# Patient Record
Sex: Male | Born: 1975 | Race: White | Hispanic: No | Marital: Single | Smoking: Never smoker
Health system: Southern US, Community
[De-identification: ages and names within clinical notes are randomized; demographics above are authoritative.]

## PROBLEM LIST (undated history)

## (undated) ENCOUNTER — Ambulatory Visit (HOSPITAL_COMMUNITY): Admission: EM | Disposition: A | Discharge status: Home / Self Care | Payer: Managed Care, Other (non HMO)

## (undated) DIAGNOSIS — S3992XA Unspecified injury of lower back, initial encounter: Secondary | ICD-10-CM

## (undated) HISTORY — PX: HERNIA REPAIR: SHX51

---

## 2003-06-08 ENCOUNTER — Emergency Department (HOSPITAL_COMMUNITY): Admission: EM | Admit: 2003-06-08 | Discharge: 2003-06-08 | Payer: Self-pay | Admitting: Emergency Medicine

## 2005-01-09 ENCOUNTER — Emergency Department (HOSPITAL_COMMUNITY): Admission: EM | Admit: 2005-01-09 | Discharge: 2005-01-09 | Payer: Self-pay | Admitting: *Deleted

## 2014-06-06 ENCOUNTER — Emergency Department (HOSPITAL_COMMUNITY)
Admission: EM | Admit: 2014-06-06 | Discharge: 2014-06-06 | Disposition: A | Discharge status: Home / Self Care | Payer: Managed Care, Other (non HMO) | Attending: Emergency Medicine | Admitting: Emergency Medicine

## 2014-06-06 ENCOUNTER — Encounter (HOSPITAL_COMMUNITY): Payer: Self-pay | Admitting: *Deleted

## 2014-06-06 ENCOUNTER — Emergency Department (HOSPITAL_COMMUNITY): Payer: Managed Care, Other (non HMO)

## 2014-06-06 DIAGNOSIS — M5416 Radiculopathy, lumbar region: Secondary | ICD-10-CM | POA: Insufficient documentation

## 2014-06-06 DIAGNOSIS — Z87828 Personal history of other (healed) physical injury and trauma: Secondary | ICD-10-CM | POA: Diagnosis not present

## 2014-06-06 DIAGNOSIS — Z880 Allergy status to penicillin: Secondary | ICD-10-CM | POA: Insufficient documentation

## 2014-06-06 DIAGNOSIS — M5410 Radiculopathy, site unspecified: Secondary | ICD-10-CM

## 2014-06-06 DIAGNOSIS — M545 Low back pain: Secondary | ICD-10-CM | POA: Diagnosis present

## 2014-06-06 HISTORY — DX: Unspecified injury of lower back, initial encounter: S39.92XA

## 2014-06-06 MED ORDER — PREDNISONE (PAK) 10 MG PO TABS: ORAL_TABLET | Freq: Every day | ORAL | Status: DC

## 2014-06-06 MED ORDER — METHYLPREDNISOLONE SODIUM SUCC 125 MG IJ SOLR
125.0000 mg | Freq: Once | INTRAMUSCULAR | Status: AC
  Administered 2014-06-06: 125 mg via INTRAVENOUS
  Filled 2014-06-06: qty 2

## 2014-06-06 MED ORDER — MORPHINE SULFATE 4 MG/ML IJ SOLN
4.0000 mg | Freq: Once | INTRAMUSCULAR | Status: AC
  Administered 2014-06-06: 4 mg via INTRAVENOUS
  Filled 2014-06-06: qty 1

## 2014-06-06 MED ORDER — DIAZEPAM 5 MG/ML IJ SOLN
5.0000 mg | Freq: Once | INTRAMUSCULAR | Status: AC
  Administered 2014-06-06: 5 mg via INTRAVENOUS
  Filled 2014-06-06: qty 2

## 2014-06-06 MED ORDER — OXYCODONE-ACETAMINOPHEN 5-325 MG PO TABS: 1.0000 | ORAL_TABLET | Freq: Four times a day (QID) | ORAL | Status: DC | PRN

## 2014-06-06 MED ORDER — HYDROMORPHONE HCL 1 MG/ML IJ SOLN
1.0000 mg | Freq: Once | INTRAMUSCULAR | Status: AC
  Administered 2014-06-06: 1 mg via INTRAVENOUS
  Filled 2014-06-06: qty 1

## 2014-06-06 MED ORDER — DOCUSATE SODIUM 100 MG PO CAPS: 100.0000 mg | ORAL_CAPSULE | Freq: Two times a day (BID) | ORAL | Status: DC

## 2014-06-06 MED ORDER — KETOROLAC TROMETHAMINE 30 MG/ML IJ SOLN
30.0000 mg | Freq: Once | INTRAMUSCULAR | Status: AC
  Administered 2014-06-06: 30 mg via INTRAVENOUS
  Filled 2014-06-06: qty 1

## 2014-06-06 NOTE — ED Provider Notes (Signed)
CSN: [id number]     Arrival date & time 06/06/14  1358 History   First MD Initiated Contact with Patient 06/06/14 1418     Chief Complaint  Patient presents with  . Back Pain     (Consider location/radiation/quality/duration/timing/severity/associated sxs/prior Treatment) HPI Comments: Patient is a 39 year old male with no significant past medical history. He presents today with complaints of severe low back pain. He was assembling a piece of furniture in his living room when he reached for one of the pieces and felt a severe pain in his low back. Pain radiates into both legs and makes it difficult for him to walk. He feels as though both legs are weak. He denies any bowel or bladder complaints.  Patient is a 39 y.o. male presenting with back pain. The history is provided by the patient.  Back Pain Location:  Lumbar spine Quality:  Stabbing Radiates to:  L foot and R foot Pain severity:  Severe Onset quality:  Sudden Duration:  2 hours Timing:  Constant Progression:  Unchanged Chronicity:  New Relieved by:  Nothing Worsened by:  Ambulation, bending, movement, standing and palpation Ineffective treatments:  Muscle relaxants   Past Medical History  Diagnosis Date  . Back injury    Past Surgical History  Procedure Laterality Date  . Hernia repair     No family history on file. History  Substance Use Topics  . Smoking status: Never Smoker   . Smokeless tobacco: Not on file  . Alcohol Use: No    Review of Systems  Musculoskeletal: Positive for back pain.  All other systems reviewed and are negative.     Allergies  Penicillins  Home Medications   Prior to Admission medications   Not on File   BP 126/71 mmHg  Pulse 69  Temp(Src) 98.4 F (36.9 C) (Oral)  Resp 13  SpO2 97% Physical Exam  Constitutional: He is oriented to person, place, and time. He appears well-developed and well-nourished. No distress.  HENT:  Head: Normocephalic and atraumatic.  Neck:  Normal range of motion. Neck supple.  Musculoskeletal: Normal range of motion.  Neurological: He is alert and oriented to person, place, and time.  DTRs are 3+ and symmetrical in the bilateral lower extremities. Strength is 5 out of 5 in the bilateral lower extremities. Any attempts to change position results in severe pain and spasm.  Skin: Skin is warm and dry. He is not diaphoretic.  Nursing note and vitals reviewed.   ED Course  Procedures (including critical care time) Labs Review Labs Reviewed - No data to display  Imaging Review No results found.   EKG Interpretation None      MDM   Final diagnoses:  Radicular low back pain    Patient presents with complaints of severe low back pain and an inability to ambulate due to the discomfort. An MRI will be obtained to rule out an emergent cause of his discomfort. He is now feeling better with pain medication and steroids. Care will be signed out at shift change to Dr. Romeo AppleHarrison who will obtain the results of the MRI and determine the final disposition.    Geoffery Lyonsouglas Kianna Billet, MD 06/07/14 [phone]

## 2014-06-06 NOTE — ED Notes (Signed)
Per EMS- pt was leaning forward to pick up something when he had "tearing sound and burn" down in lower back. Pt reports numbness in legs. Pt states that he has had a back injury in the past. Pt states that he was unable to bear weight on legs. Pt received of fentanyl en route.

## 2014-06-06 NOTE — ED Notes (Signed)
Pt verbalized understanding of d/c instructions, follow up information with neurosurgeon, and prescriptions, has no further questions.

## 2014-06-06 NOTE — Discharge Instructions (Signed)
Back Pain, Adult °Back pain is very common. The pain often gets better over time. The cause of back pain is usually not dangerous. Most people can learn to manage their back pain on their own.  °HOME CARE  °· Stay active. Start with short walks on flat ground if you can. Try to walk farther each day. °· Do not sit, drive, or stand in one place for more than 30 minutes. Do not stay in bed. °· Do not avoid exercise or work. Activity can help your back heal faster. °· Be careful when you bend or lift an object. Bend at your knees, keep the object close to you, and do not twist. °· Sleep on a firm mattress. Lie on your side, and bend your knees. If you lie on your back, put a pillow under your knees. °· Only take medicines as told by your doctor. °· Put ice on the injured area. °¨ Put ice in a plastic bag. °¨ Place a towel between your skin and the bag. °¨ Leave the ice on for 15-20 minutes, 03-04 times a day for the first 2 to 3 days. After that, you can switch between ice and heat packs. °· Ask your doctor about back exercises or massage. °· Avoid feeling anxious or stressed. Find good ways to deal with stress, such as exercise. °GET HELP RIGHT AWAY IF:  °· Your pain does not go away with rest or medicine. °· Your pain does not go away in 1 week. °· You have new problems. °· You do not feel well. °· The pain spreads into your legs. °· You cannot control when you poop (bowel movement) or pee (urinate). °· Your arms or legs feel weak or lose feeling (numbness). °· You feel sick to your stomach (nauseous) or throw up (vomit). °· You have belly (abdominal) pain. °· You feel like you may pass out (faint). °MAKE SURE YOU:  °· Understand these instructions. °· Will watch your condition. °· Will get help right away if you are not doing well or get worse. °Document Released: 09/10/2007 Document Revised: 06/16/2011 Document Reviewed: 07/26/2013 °Exit[employer]® Patient Information ©2015 [employer]. This information is not intended  to replace advice given to you by your health care provider. Make sure you discuss any questions you have with your health care provider. ° °

## 2014-06-06 NOTE — ED Notes (Signed)
Pt given urinal and helped over to his side to urinate.

## 2020-05-08 DIAGNOSIS — Z419 Encounter for procedure for purposes other than remedying health state, unspecified: Secondary | ICD-10-CM | POA: Diagnosis not present

## 2020-06-05 DIAGNOSIS — Z419 Encounter for procedure for purposes other than remedying health state, unspecified: Secondary | ICD-10-CM | POA: Diagnosis not present

## 2020-07-06 DIAGNOSIS — Z419 Encounter for procedure for purposes other than remedying health state, unspecified: Secondary | ICD-10-CM | POA: Diagnosis not present

## 2020-08-05 DIAGNOSIS — Z419 Encounter for procedure for purposes other than remedying health state, unspecified: Secondary | ICD-10-CM | POA: Diagnosis not present

## 2020-09-05 DIAGNOSIS — Z419 Encounter for procedure for purposes other than remedying health state, unspecified: Secondary | ICD-10-CM | POA: Diagnosis not present

## 2020-10-05 DIAGNOSIS — Z419 Encounter for procedure for purposes other than remedying health state, unspecified: Secondary | ICD-10-CM | POA: Diagnosis not present

## 2020-11-05 DIAGNOSIS — Z419 Encounter for procedure for purposes other than remedying health state, unspecified: Secondary | ICD-10-CM | POA: Diagnosis not present

## 2020-12-06 DIAGNOSIS — Z419 Encounter for procedure for purposes other than remedying health state, unspecified: Secondary | ICD-10-CM | POA: Diagnosis not present

## 2021-01-05 DIAGNOSIS — Z419 Encounter for procedure for purposes other than remedying health state, unspecified: Secondary | ICD-10-CM | POA: Diagnosis not present

## 2021-02-05 DIAGNOSIS — Z419 Encounter for procedure for purposes other than remedying health state, unspecified: Secondary | ICD-10-CM | POA: Diagnosis not present

## 2021-03-07 DIAGNOSIS — Z419 Encounter for procedure for purposes other than remedying health state, unspecified: Secondary | ICD-10-CM | POA: Diagnosis not present

## 2021-04-07 DIAGNOSIS — Z419 Encounter for procedure for purposes other than remedying health state, unspecified: Secondary | ICD-10-CM | POA: Diagnosis not present

## 2021-04-13 ENCOUNTER — Encounter (HOSPITAL_BASED_OUTPATIENT_CLINIC_OR_DEPARTMENT_OTHER): Payer: Self-pay | Admitting: *Deleted

## 2021-04-13 ENCOUNTER — Other Ambulatory Visit: Payer: Self-pay

## 2021-04-13 ENCOUNTER — Emergency Department (HOSPITAL_BASED_OUTPATIENT_CLINIC_OR_DEPARTMENT_OTHER): Payer: Medicaid Other | Admitting: Radiology

## 2021-04-13 ENCOUNTER — Emergency Department (HOSPITAL_BASED_OUTPATIENT_CLINIC_OR_DEPARTMENT_OTHER)
Admission: EM | Admit: 2021-04-13 | Discharge: 2021-04-14 | Disposition: A | Discharge status: Home / Self Care | Payer: Medicaid Other | Attending: Emergency Medicine | Admitting: Emergency Medicine

## 2021-04-13 DIAGNOSIS — T18128A Food in esophagus causing other injury, initial encounter: Secondary | ICD-10-CM | POA: Insufficient documentation

## 2021-04-13 DIAGNOSIS — R062 Wheezing: Secondary | ICD-10-CM | POA: Insufficient documentation

## 2021-04-13 DIAGNOSIS — M542 Cervicalgia: Secondary | ICD-10-CM | POA: Diagnosis not present

## 2021-04-13 DIAGNOSIS — Z20822 Contact with and (suspected) exposure to covid-19: Secondary | ICD-10-CM | POA: Insufficient documentation

## 2021-04-13 DIAGNOSIS — K2900 Acute gastritis without bleeding: Secondary | ICD-10-CM | POA: Diagnosis not present

## 2021-04-13 DIAGNOSIS — K2091 Esophagitis, unspecified with bleeding: Secondary | ICD-10-CM | POA: Insufficient documentation

## 2021-04-13 DIAGNOSIS — K222 Esophageal obstruction: Secondary | ICD-10-CM | POA: Diagnosis not present

## 2021-04-13 DIAGNOSIS — M795 Residual foreign body in soft tissue: Secondary | ICD-10-CM

## 2021-04-13 DIAGNOSIS — R079 Chest pain, unspecified: Secondary | ICD-10-CM | POA: Diagnosis not present

## 2021-04-13 DIAGNOSIS — X58XXXA Exposure to other specified factors, initial encounter: Secondary | ICD-10-CM | POA: Insufficient documentation

## 2021-04-13 DIAGNOSIS — T189XXA Foreign body of alimentary tract, part unspecified, initial encounter: Secondary | ICD-10-CM | POA: Diagnosis not present

## 2021-04-13 LAB — RESP PANEL BY RT-PCR (FLU A&B, COVID) ARPGX2
Influenza A by PCR: NEGATIVE
Influenza B by PCR: NEGATIVE
SARS Coronavirus 2 by RT PCR: NEGATIVE

## 2021-04-13 MED ORDER — LORAZEPAM 2 MG/ML IJ SOLN
1.0000 mg | Freq: Once | INTRAMUSCULAR | Status: AC
  Administered 2021-04-13: 1 mg via INTRAVENOUS
  Filled 2021-04-13: qty 1

## 2021-04-13 MED ORDER — PROPOFOL 10 MG/ML IV BOLUS
INTRAVENOUS | Status: AC
  Filled 2021-04-13: qty 20

## 2021-04-13 MED ORDER — GLUCAGON HCL RDNA (DIAGNOSTIC) 1 MG IJ SOLR
1.0000 mg | Freq: Once | INTRAMUSCULAR | Status: AC
  Administered 2021-04-13: 1 mg via INTRAVENOUS
  Filled 2021-04-13: qty 1

## 2021-04-13 NOTE — ED Provider Notes (Signed)
Patient transferred from [facility] for endoscopy.  He was eating ribs around 530 and feels like they are stuck in his esophagus.  He has been unable to tolerate oral since this time.  Plan to transfer to endoscopy suite for further management.   Patient returned from endoscopy suite.  He is feeling improved and able to swallow without difficulty.  Plan to discharge home with outpatient follow-up.   Tilden Fossa, MD 04/14/21 [phone]

## 2021-04-13 NOTE — ED Notes (Signed)
Patient off floor for scan 

## 2021-04-13 NOTE — ED Triage Notes (Addendum)
eating ribs at 1730 and food became lodged, nothing will go down, water comes back up, no h/o same.

## 2021-04-13 NOTE — Discharge Instructions (Addendum)
CLEAR LIQUIDS ONLY for the next 12 hours and then start soups and broths and advance diet as tolerated. AVOID ALL MEATS for the next 48 hours. Take small bites of food, eat slowly, and chew very well. Start taking Omeprazole (Prilosec) 20 mg once a day. A repeat upper endoscopy (EGD) with possible dilation will be needed in 4 weeks and our office will arrange that as well as follow-up.

## 2021-04-13 NOTE — ED Notes (Signed)
Patient in room, wife at bedside.

## 2021-04-13 NOTE — ED Provider Notes (Signed)
[facility] EMERGENCY DEPT Provider Note   CSN: [id number] Arrival date & time: 04/13/21  6720     History  Chief Complaint  Patient presents with   Swallowed Foreign Body    NESANEL AGUILA is a 46 y.o. male who presents to the emergency department complaining of a swallowed foreign body.  Patient was eating ribs at 1730, and food became lodged.  He states that nothing will go down, and water comes back up.  He has associated chest pain, and feels he is wheezing when he takes deep breaths through his mouth.   Swallowed Foreign Body      Home Medications Prior to Admission medications   Medication Sig Start Date End Date Taking? Authorizing Provider  docusate sodium (COLACE) 100 MG capsule Take 1 capsule (100 mg total) by mouth every 12 (twelve) hours. 06/06/14   Purvis Sheffield, MD  ibuprofen (ADVIL,MOTRIN) 200 MG tablet Take 200 mg by mouth every 6 (six) hours as needed.    [provider]  methocarbamol (ROBAXIN) 750 MG tablet Take 750 mg by mouth 2 (two) times daily as needed for muscle spasms.    [provider]  oxyCODONE-acetaminophen (PERCOCET) 5-325 MG per tablet Take 1-2 tablets by mouth every 6 (six) hours as needed for moderate pain. 06/06/14   Purvis Sheffield, MD  predniSONE (STERAPRED UNI-PAK) 10 MG tablet Take by mouth daily. Tape 4 tablets by mouth daily for 3 days. Then take 3 tablets by mouth daily for 3 days. Intake 2 tablets by mouth daily for 3 days. Then take 1 tablet by mouth daily for 3 days. 06/06/14   Purvis Sheffield, MD      Allergies    Penicillins    Review of Systems   Review of Systems  HENT:  Positive for trouble swallowing.   All other systems reviewed and are negative.  Physical Exam Updated Vital Signs BP (!) 143/98    Pulse 89    Temp 98.7 F (37.1 C)    Resp 19    Ht 6\' 1"  (1.854 m)    Wt 113.4 kg    SpO2 98%    BMI 32.98 kg/m  Physical Exam Vitals and nursing note reviewed.  Constitutional:       Appearance: Normal appearance.  HENT:     Head: Normocephalic and atraumatic.  Eyes:     Conjunctiva/sclera: Conjunctivae normal.  Neck:     Trachea: Trachea normal.  Pulmonary:     Effort: Pulmonary effort is normal. No respiratory distress.     Breath sounds: No stridor. No wheezing.  Skin:    General: Skin is warm and dry.  Neurological:     Mental Status: He is alert.  Psychiatric:        Mood and Affect: Mood normal.        Behavior: Behavior normal.    ED Results / Procedures / Treatments   Labs (all labs ordered are listed, but only abnormal results are displayed) Labs Reviewed - No data to display  EKG None  Radiology DG Neck Soft Tissue  Result Date: 04/13/2021 CLINICAL DATA:  Food lodged. EXAM: NECK SOFT TISSUES - 1+ VIEW COMPARISON:  None. FINDINGS: There is no evidence of retropharyngeal soft tissue swelling or epiglottic enlargement. The cervical airway is unremarkable and no radio-opaque foreign body identified. IMPRESSION: Negative. Electronically Signed   By: 06/11/2021 M.D.   On: 04/13/2021 20:56    Procedures Procedures    Medications Ordered in ED Medications  LORazepam (ATIVAN) injection 1 mg (has no administration in time range)  glucagon (human recombinant) (GLUCAGEN) injection 1 mg (has no administration in time range)    ED Course/ Medical Decision Making/ A&P                           Medical Decision Making Patient is a 46 year old male presents emergency department for swallowed foreign body.  Patient was eating ribs at 1730 when food became lodged, he states that nothing will go down.    He is oxygenating well on my exam.  He states he is having some wheezing when he takes deep breaths or his mouth, but there is no obvious stridor or wheezing on lung auscultation.  X-ray performed shows no retained radiopaque foreign body.  I agree with radiologist interpretation.  Patient given 1 dose of Ativan and glucagon with no  improvement.  10:00pm Consulted Dr. Bosie Clos with gastroenterology who recommended patient get an endoscopy at [facility]. Will obtain pre-op COVID test.   10:10pm Consulted ED physician Dr. Effie Shy at [facility] who agreed to accept patient and contact GI upon patient arrival. Patient will go POV. The patient appears reasonably stabilized for transfer considering the current resources, flow, and capabilities available in the ED at this time, and I doubt any other [facility] requiring further screening and/or treatment in the ED prior to transfer.   I discussed this case with my attending physician Dr. Anitra Lauth who cosigned this note including patient's presenting symptoms, physical exam, and planned diagnostics and interventions. Attending physician stated agreement with plan or made changes to plan which were implemented.    Final Clinical Impression(s) / ED Diagnoses Final diagnoses:  Neck pain after ingestion of foreign body    Rx / DC Orders ED Discharge Orders     None      Portions of this report may have been transcribed using voice recognition software. Every effort was made to ensure accuracy; however, inadvertent computerized transcription errors may be present.    Jeanella Flattery 04/13/21 2216    Gwyneth Sprout, MD 04/13/21 2326

## 2021-04-14 ENCOUNTER — Emergency Department (HOSPITAL_COMMUNITY): Payer: Medicaid Other | Admitting: Certified Registered Nurse Anesthetist

## 2021-04-14 ENCOUNTER — Encounter (HOSPITAL_COMMUNITY)
Admission: EM | Disposition: A | Discharge status: Home / Self Care | Payer: Self-pay | Source: Home / Self Care | Attending: Emergency Medicine

## 2021-04-14 DIAGNOSIS — K2091 Esophagitis, unspecified with bleeding: Secondary | ICD-10-CM | POA: Diagnosis not present

## 2021-04-14 DIAGNOSIS — R079 Chest pain, unspecified: Secondary | ICD-10-CM | POA: Diagnosis not present

## 2021-04-14 DIAGNOSIS — T18108A Unspecified foreign body in esophagus causing other injury, initial encounter: Secondary | ICD-10-CM | POA: Diagnosis not present

## 2021-04-14 DIAGNOSIS — W44F3XA Food entering into or through a natural orifice, initial encounter: Secondary | ICD-10-CM | POA: Diagnosis present

## 2021-04-14 DIAGNOSIS — T18128A Food in esophagus causing other injury, initial encounter: Secondary | ICD-10-CM | POA: Diagnosis not present

## 2021-04-14 DIAGNOSIS — Z20822 Contact with and (suspected) exposure to covid-19: Secondary | ICD-10-CM | POA: Diagnosis not present

## 2021-04-14 DIAGNOSIS — K2900 Acute gastritis without bleeding: Secondary | ICD-10-CM | POA: Diagnosis not present

## 2021-04-14 DIAGNOSIS — K222 Esophageal obstruction: Secondary | ICD-10-CM | POA: Diagnosis not present

## 2021-04-14 DIAGNOSIS — R062 Wheezing: Secondary | ICD-10-CM | POA: Diagnosis not present

## 2021-04-14 DIAGNOSIS — M542 Cervicalgia: Secondary | ICD-10-CM | POA: Diagnosis not present

## 2021-04-14 HISTORY — PX: ESOPHAGOGASTRODUODENOSCOPY: SHX5428

## 2021-04-14 HISTORY — PX: FOREIGN BODY REMOVAL: SHX962

## 2021-04-14 SURGERY — EGD (ESOPHAGOGASTRODUODENOSCOPY)
Anesthesia: General

## 2021-04-14 MED ORDER — FENTANYL CITRATE (PF) 100 MCG/2ML IJ SOLN: 25.0000 ug | INTRAMUSCULAR | Status: DC | PRN

## 2021-04-14 MED ORDER — ONDANSETRON HCL 4 MG/2ML IJ SOLN
INTRAMUSCULAR | Status: DC | PRN
  Administered 2021-04-14: 4 mg via INTRAVENOUS

## 2021-04-14 MED ORDER — LACTATED RINGERS IV SOLN: INTRAVENOUS | Status: DC | PRN

## 2021-04-14 MED ORDER — DEXAMETHASONE SODIUM PHOSPHATE 10 MG/ML IJ SOLN
INTRAMUSCULAR | Status: DC | PRN
  Administered 2021-04-14: 10 mg via INTRAVENOUS

## 2021-04-14 MED ORDER — LIDOCAINE 2% (20 MG/ML) 5 ML SYRINGE
INTRAMUSCULAR | Status: DC | PRN
  Administered 2021-04-14: 60 mg via INTRAVENOUS

## 2021-04-14 MED ORDER — ONDANSETRON HCL 4 MG/2ML IJ SOLN: 4.0000 mg | Freq: Four times a day (QID) | INTRAMUSCULAR | Status: DC | PRN

## 2021-04-14 MED ORDER — SUCCINYLCHOLINE CHLORIDE 200 MG/10ML IV SOSY
PREFILLED_SYRINGE | INTRAVENOUS | Status: DC | PRN
  Administered 2021-04-14: 140 mg via INTRAVENOUS

## 2021-04-14 MED ORDER — OXYCODONE HCL 5 MG PO TABS: 5.0000 mg | ORAL_TABLET | Freq: Once | ORAL | Status: DC | PRN

## 2021-04-14 MED ORDER — PROPOFOL 10 MG/ML IV BOLUS
INTRAVENOUS | Status: DC | PRN
  Administered 2021-04-14: 200 mg via INTRAVENOUS

## 2021-04-14 MED ORDER — OXYCODONE HCL 5 MG/5ML PO SOLN: 5.0000 mg | Freq: Once | ORAL | Status: DC | PRN

## 2021-04-14 MED ORDER — SODIUM CHLORIDE 0.9 % IV SOLN: INTRAVENOUS | Status: DC

## 2021-04-14 NOTE — H&P (View-Only) (Signed)
Referring Provider: Dr. Plunkett ([facility]) °Primary Care Physician:  Pcp, No °Primary Gastroenterologist:  Unassigned ° °Reason for Consultation:  Food impaction ° °HPI: Jerome Luna is a 46 y.o. male felt food hang up after taking his first bite of ribs this evening and has not been able to swallow liquids or saliva since then. Has vomited 20-30 times since swallowing the food but denies any food coming up with the vomiting. No relief with Glucagon and Ativan at [facility]. Denies any trouble swallowing foods or liquids prior to this episode. Occasional heartburn. COVID negative. ° °Past Medical History:  °Diagnosis Date  ° Back injury   ° ° °Past Surgical History:  °Procedure Laterality Date  ° HERNIA REPAIR    ° ° °Prior to Admission medications   °Medication Sig Start Date End Date Taking? Authorizing Provider  °docusate sodium (COLACE) 100 MG capsule Take 1 capsule (100 mg total) by mouth every 12 (twelve) hours. 06/06/14   Harrison, Forrest, MD  °ibuprofen (ADVIL,MOTRIN) 200 MG tablet Take 200 mg by mouth every 6 (six) hours as needed.    [provider]  °methocarbamol (ROBAXIN) 750 MG tablet Take 750 mg by mouth 2 (two) times daily as needed for muscle spasms.    [provider]  °oxyCODONE-acetaminophen (PERCOCET) 5-325 MG per tablet Take 1-2 tablets by mouth every 6 (six) hours as needed for moderate pain. 06/06/14   Harrison, Forrest, MD  °predniSONE (STERAPRED UNI-PAK) 10 MG tablet Take by mouth daily. Tape 4 tablets by mouth daily for 3 days. Then take 3 tablets by mouth daily for 3 days. Intake 2 tablets by mouth daily for 3 days. Then take 1 tablet by mouth daily for 3 days. 06/06/14   Harrison, Forrest, MD  ° ° °Scheduled Meds: °Continuous Infusions: ° sodium chloride    ° °PRN Meds:. ° °Allergies as of 04/13/2021 - Review Complete 04/13/2021  °Allergen Reaction Noted  ° Penicillins Hives 06/06/2014  ° ° °History reviewed. No pertinent family history. ° °Social History   ° °Socioeconomic History  ° Marital status: Single  °  Spouse name: Not on file  ° Number of children: Not on file  ° Years of education: Not on file  ° Highest education level: Not on file  °Occupational History  ° Not on file  °Tobacco Use  ° Smoking status: Never  ° Smokeless tobacco: Not on file  °Substance and Sexual Activity  ° Alcohol use: No  ° Drug use: No  ° Sexual activity: Not on file  °Other Topics Concern  ° Not on file  °Social History Narrative  ° Not on file  ° °Social Determinants of Health  ° °Financial Resource Strain: Not on file  °Food Insecurity: Not on file  °Transportation Needs: Not on file  °Physical Activity: Not on file  °Stress: Not on file  °Social Connections: Not on file  °Intimate Partner Violence: Not on file  ° ° °Review of Systems: All negative except as stated above in HPI. ° °Physical Exam: °Vital signs: °Vitals:  ° 04/13/21 2330 04/13/21 2350  °BP: (!) 144/80 (!) 168/95  °Pulse: 98 90  °Resp: 16 14  °Temp:    °SpO2: 96% 97%  °T 98.2 °  °General:   Lethargic, mild acute distress, obese  °Head: normocephalic, atraumatic °Eyes: anicteric sclera °ENT: oropharynx clear °Neck: supple, nontender °Lungs:  Clear throughout to auscultation.   No wheezes, crackles, or rhonchi. No acute distress. °Heart:  Regular rate and rhythm; no murmurs,   clicks, rubs,  or gallops. °Abdomen: epigastric tenderness without guarding, soft, nondistended, +BS  °Rectal:  Deferred °Ext: no edema ° °GI:  °Lab Results: °No results for input(s): WBC, HGB, HCT, PLT in the last 72 hours. °BMET °No results for input(s): NA, K, CL, CO2, GLUCOSE, BUN, CREATININE, CALCIUM in the last 72 hours. °LFT °No results for input(s): PROT, ALBUMIN, AST, ALT, ALKPHOS, BILITOT, BILIDIR, IBILI in the last 72 hours. °PT/INR °No results for input(s): LABPROT, INR in the last 72 hours. ° ° °Studies/Results: °DG Neck Soft Tissue ° °Result Date: 04/13/2021 °CLINICAL DATA:  Food lodged. EXAM: NECK SOFT TISSUES - 1+ VIEW COMPARISON:   None. FINDINGS: There is no evidence of retropharyngeal soft tissue swelling or epiglottic enlargement. The cervical airway is unremarkable and no radio-opaque foreign body identified. IMPRESSION: Negative. Electronically Signed   By: Kevin  Dover M.D.   On: 04/13/2021 20:56   ° °Impression/Plan: °Food impaction after swallowing his first bite of ribs this evening. Denies previous episodes of dysphagia. EGD needed to clear the food bolus. COVID test negative. Risks/benefits of EGD discussed with patient and he agrees to proceed. ° ° ° LOS: 0 days  ° °Sheetal Lyall C Jamey Harman  04/14/2021, 12:04 AM ° °Questions please call [phone]  ° °

## 2021-04-14 NOTE — ED Notes (Signed)
Patient was able to ambulate around the room. Patient also was PO challenged with water. Patient was able to swallow with out difficulty.

## 2021-04-14 NOTE — Interval H&P Note (Signed)
History and Physical Interval Note:  04/14/2021 12:10 AM  Jerome Luna  has presented today for surgery, with the diagnosis of food impaction.  The various methods of treatment have been discussed with the patient and family. After consideration of risks, benefits and other options for treatment, the patient has consented to  Procedure(s): ESOPHAGOGASTRODUODENOSCOPY (EGD) (N/A) FOREIGN BODY REMOVAL (N/A) as a surgical intervention.  The patient's history has been reviewed, patient examined, no change in status, stable for surgery.  I have reviewed the patient's chart and labs.  Questions were answered to the patient's satisfaction.     Shirley Friar

## 2021-04-14 NOTE — Op Note (Signed)
[facility] Patient Name: Jerome Luna Procedure Date: 04/14/2021 MRN: [id number] Attending MD: Lear Ng , MD Date of Birth: Sep 25, 1975 CSN: [id number] Age: 46 Admit Type: Emergency Department Procedure:                Upper GI endoscopy Indications:              Foreign body in the esophagus Providers:                Lear Ng, MD, Grace Isaac, RN, Tyna Jaksch Technician Referring MD:             ER Medicines:                Propofol per Anesthesia, Monitored Anesthesia Care;                            Intubated prior to procedure Complications:            No immediate complications. Estimated Blood Loss:     Estimated blood loss was minimal. Procedure:                Pre-Anesthesia Assessment:                           - Prior to the procedure, a History and Physical                            was performed, and patient medications and                            allergies were reviewed. The patient's tolerance of                            previous anesthesia was also reviewed. The risks                            and benefits of the procedure and the sedation                            options and risks were discussed with the patient.                            All questions were answered, and informed consent                            was obtained. Prior Anticoagulants: The patient has                            taken no previous anticoagulant or antiplatelet                            agents. ASA Grade Assessment: II - A patient with  mild systemic disease. After reviewing the risks                            and benefits, the patient was deemed in                            satisfactory condition to undergo the procedure.                           After obtaining informed consent, the endoscope was                            passed under direct vision. Throughout the                             procedure, the patient's blood pressure, pulse, and                            oxygen saturations were monitored continuously. The                            [id number]-[id number] ([id number]) Olympus endoscope was introduced                            through the mouth, and advanced to the second part                            of duodenum. The upper GI endoscopy was                            accomplished without difficulty. The patient                            tolerated the procedure well. Scope In: Scope Out: Findings:      Food was found in the distal esophagus. Removal of food was       accomplished. Estimated blood loss was minimal.      One benign-appearing, intrinsic moderate (circumferential scarring or       stenosis; an endoscope may pass) stenosis was found in the distal       esophagus. The stenosis was traversed.      LA Grade C (one or more mucosal breaks continuous between tops of 2 or       more mucosal folds, less than 75% circumference) esophagitis with       bleeding was found in the distal esophagus.      Segmental mild inflammation characterized by congestion (edema) and       erythema was found in the gastric antrum.      The cardia and gastric fundus were normal on retroflexion.      The examined duodenum was normal.      Mucosal changes including longitudinal furrows were found in the distal       esophagus. Impression:               - Food in the distal esophagus. Removal was  successful.                           - Benign-appearing esophageal stenosis.                           - LA Grade C esophagitis with bleeding.                           - Acute gastritis.                           - Normal examined duodenum. Moderate Sedation:      N/A - MAC procedure Recommendation:           - Patient has a contact number available for                            emergencies. The signs and symptoms of potential                            delayed  complications were discussed with the                            patient. Return to normal activities tomorrow.                            Written discharge instructions were provided to the                            patient.                           - Clear liquid diet.                           - Clear liquid diet for the next 12 hours and then                            slowly advance as tolerated. Avoid any meats for                            the next 48 hours and when you resume meat you must                            chew your food very well, eat slow and take small                            bites. A repeat endoscopy is needed in 4 weeks for                            possible dilation of your esophagus and for                            biopsies for eosinophilic esophagitis. Due to the  amount of mucosal trauma biopsies were not taken                            during this procedure and will await healing of the                            mucosa for re-evaluation for possible dilation and                            biopsies.                           - Use Prilosec (omeprazole) 20 mg PO daily.                           - Repeat upper endoscopy in 1 month with possible                            dilation and biopsies. Procedure Code(s):        --- Professional ---                           [phone], Esophagogastroduodenoscopy, flexible,                            transoral; with removal of foreign body(s) Diagnosis Code(s):        --- Professional ---                           W10.932T, Food in esophagus causing other injury,                            initial encounter                           K22.2, Esophageal obstruction                           T18.108A, Unspecified foreign body in esophagus                            causing other injury, initial encounter                           K20.91, Esophagitis, unspecified with bleeding                            K29.00, Acute gastritis without bleeding CPT copyright 2019 [employer]. All rights reserved. The codes documented in this report are preliminary and upon coder review may  be revised to meet current compliance requirements. Lear Ng, MD 04/14/2021 12:38:34 AM This report has been signed electronically. Number of Addenda: 0

## 2021-04-14 NOTE — Anesthesia Procedure Notes (Signed)
Procedure Name: Intubation Date/Time: 04/14/2021 12:14 AM Performed by: Sudie Grumbling, CRNA Pre-anesthesia Checklist: Patient identified, Emergency Drugs available, Suction available and Patient being monitored Patient Re-evaluated:Patient Re-evaluated prior to induction Oxygen Delivery Method: Circle system utilized Preoxygenation: Pre-oxygenation with 100% oxygen Induction Type: IV induction and Rapid sequence Laryngoscope Size: Miller and 3 Grade View: Grade I Tube type: Oral Tube size: 7.0 mm Number of attempts: 1 Airway Equipment and Method: Stylet Placement Confirmation: ETT inserted through vocal cords under direct vision, positive ETCO2 and breath sounds checked- equal and bilateral Secured at: 23 cm Tube secured with: Tape Dental Injury: Teeth and Oropharynx as per pre-operative assessment

## 2021-04-14 NOTE — ED Notes (Signed)
Patient back in room

## 2021-04-14 NOTE — ED Notes (Signed)
Report received from Endo  

## 2021-04-14 NOTE — Consult Note (Signed)
Referring Provider: Dr. Maryan Rued ([place]) Primary Care Physician:  Pcp, No Primary Gastroenterologist:  Althia Forts  Reason for Consultation:  Food impaction  HPI: Jerome Luna is a 46 y.o. male felt food hang up after taking his first bite of ribs this evening and has not been able to swallow liquids or saliva since then. Has vomited 20-30 times since swallowing the food but denies any food coming up with the vomiting. No relief with Glucagon and Ativan at [facility]. Denies any trouble swallowing foods or liquids prior to this episode. Occasional heartburn. COVID negative.  Past Medical History:  Diagnosis Date   Back injury     Past Surgical History:  Procedure Laterality Date   HERNIA REPAIR      Prior to Admission medications   Medication Sig Start Date End Date Taking? Authorizing Provider  docusate sodium (COLACE) 100 MG capsule Take 1 capsule (100 mg total) by mouth every 12 (twelve) hours. 06/06/14   Pamella Pert, MD  ibuprofen (ADVIL,MOTRIN) 200 MG tablet Take 200 mg by mouth every 6 (six) hours as needed.    [provider]  methocarbamol (ROBAXIN) 750 MG tablet Take 750 mg by mouth 2 (two) times daily as needed for muscle spasms.    [provider]  oxyCODONE-acetaminophen (PERCOCET) 5-325 MG per tablet Take 1-2 tablets by mouth every 6 (six) hours as needed for moderate pain. 06/06/14   Pamella Pert, MD  predniSONE (STERAPRED UNI-PAK) 10 MG tablet Take by mouth daily. Tape 4 tablets by mouth daily for 3 days. Then take 3 tablets by mouth daily for 3 days. Intake 2 tablets by mouth daily for 3 days. Then take 1 tablet by mouth daily for 3 days. 06/06/14   Pamella Pert, MD    Scheduled Meds: Continuous Infusions:  sodium chloride     PRN Meds:.  Allergies as of 04/13/2021 - Review Complete 04/13/2021  Allergen Reaction Noted   Penicillins Hives 06/06/2014    History reviewed. No pertinent family history.  Social History    Socioeconomic History   Marital status: Single    Spouse name: Not on file   Number of children: Not on file   Years of education: Not on file   Highest education level: Not on file  Occupational History   Not on file  Tobacco Use   Smoking status: Never   Smokeless tobacco: Not on file  Substance and Sexual Activity   Alcohol use: No   Drug use: No   Sexual activity: Not on file  Other Topics Concern   Not on file  Social History Narrative   Not on file   Social Determinants of Health   Financial Resource Strain: Not on file  Food Insecurity: Not on file  Transportation Needs: Not on file  Physical Activity: Not on file  Stress: Not on file  Social Connections: Not on file  Intimate Partner Violence: Not on file    Review of Systems: All negative except as stated above in HPI.  Physical Exam: Vital signs: Vitals:   04/13/21 2330 04/13/21 2350  BP: (!) 144/80 (!) 168/95  Pulse: 98 90  Resp: 16 14  Temp:    SpO2: 96% 97%  T 98.2   General:   Lethargic, mild acute distress, obese  Head: normocephalic, atraumatic Eyes: anicteric sclera ENT: oropharynx clear Neck: supple, nontender Lungs:  Clear throughout to auscultation.   No wheezes, crackles, or rhonchi. No acute distress. Heart:  Regular rate and rhythm; no murmurs,  clicks, rubs,  or gallops. Abdomen: epigastric tenderness without guarding, soft, nondistended, +BS  Rectal:  Deferred Ext: no edema  GI:  Lab Results: No results for input(s): WBC, HGB, HCT, PLT in the last 72 hours. BMET No results for input(s): NA, K, CL, CO2, GLUCOSE, BUN, CREATININE, CALCIUM in the last 72 hours. LFT No results for input(s): PROT, ALBUMIN, AST, ALT, ALKPHOS, BILITOT, BILIDIR, IBILI in the last 72 hours. PT/INR No results for input(s): LABPROT, INR in the last 72 hours.   Studies/Results: DG Neck Soft Tissue  Result Date: 04/13/2021 CLINICAL DATA:  Food lodged. EXAM: NECK SOFT TISSUES - 1+ VIEW COMPARISON:   None. FINDINGS: There is no evidence of retropharyngeal soft tissue swelling or epiglottic enlargement. The cervical airway is unremarkable and no radio-opaque foreign body identified. IMPRESSION: Negative. Electronically Signed   By: Rolm Baptise M.D.   On: 04/13/2021 20:56    Impression/Plan: Food impaction after swallowing his first bite of ribs this evening. Denies previous episodes of dysphagia. EGD needed to clear the food bolus. COVID test negative. Risks/benefits of EGD discussed with patient and he agrees to proceed.    LOS: 0 days   Lear Ng  04/14/2021, 12:04 AM  Questions please call [phone]

## 2021-04-14 NOTE — Transfer of Care (Signed)
Immediate Anesthesia Transfer of Care Note  Patient: Jerome Luna  Procedure(s) Performed: ESOPHAGOGASTRODUODENOSCOPY (EGD) FOREIGN BODY REMOVAL  Patient Location: PACU and Endoscopy Unit  Anesthesia Type:General  Level of Consciousness: awake, alert  and oriented  Airway & Oxygen Therapy: Patient Spontanous Breathing and Patient connected to face mask  Post-op Assessment: Report given to RN and Post -op Vital signs reviewed and stable  Post vital signs: Reviewed and stable  Last Vitals:  Vitals Value Taken Time  BP    Temp    Pulse    Resp    SpO2      Last Pain:  Vitals:   04/13/21 2350  TempSrc: Oral  PainSc: 3          Complications: No notable events documented.

## 2021-04-14 NOTE — Anesthesia Preprocedure Evaluation (Signed)
Anesthesia Evaluation  Patient identified by MRN, date of birth, ID band Patient awake    Reviewed: Allergy & Precautions, H&P , NPO status , Patient's Chart, lab work & pertinent test results  Airway Mallampati: II   Neck ROM: full    Dental   Pulmonary neg pulmonary ROS,    breath sounds clear to auscultation       Cardiovascular negative cardio ROS   Rhythm:regular Rate:Normal     Neuro/Psych    GI/Hepatic Food impaction   Endo/Other    Renal/GU      Musculoskeletal   Abdominal   Peds  Hematology   Anesthesia Other Findings   Reproductive/Obstetrics                             Anesthesia Physical Anesthesia Plan  ASA: 2  Anesthesia Plan: General   Post-op Pain Management:    Induction: Intravenous, Rapid sequence and Cricoid pressure planned  PONV Risk Score and Plan: 2 and Ondansetron, Dexamethasone, Midazolam and Treatment may vary due to age or medical condition  Airway Management Planned: Oral ETT  Additional Equipment:   Intra-op Plan:   Post-operative Plan: Extubation in OR  Informed Consent: I have reviewed the patients History and Physical, chart, labs and discussed the procedure including the risks, benefits and alternatives for the proposed anesthesia with the patient or authorized representative who has indicated his/her understanding and acceptance.     Dental advisory given  Plan Discussed with: CRNA, Surgeon and Anesthesiologist  Anesthesia Plan Comments:         Anesthesia Quick Evaluation

## 2021-04-15 ENCOUNTER — Encounter (HOSPITAL_COMMUNITY): Payer: Self-pay | Admitting: Gastroenterology

## 2021-04-15 NOTE — Anesthesia Postprocedure Evaluation (Signed)
Anesthesia Post Note  Patient: Jerome Luna  Procedure(s) Performed: ESOPHAGOGASTRODUODENOSCOPY (EGD) FOREIGN BODY REMOVAL     Patient location during evaluation: Endoscopy Anesthesia Type: General Level of consciousness: awake and alert Pain management: pain level controlled Vital Signs Assessment: post-procedure vital signs reviewed and stable Respiratory status: spontaneous breathing, nonlabored ventilation, respiratory function stable and patient connected to nasal cannula oxygen Cardiovascular status: blood pressure returned to baseline and stable Postop Assessment: no apparent nausea or vomiting Anesthetic complications: no   No notable events documented.  Last Vitals:  Vitals:   04/14/21 0059 04/14/21 0125  BP: (!) 156/100 (!) 137/97  Pulse: (!) 105 98  Resp: 15 17  Temp:    SpO2: 93% 95%    Last Pain:  Vitals:   04/14/21 0154  TempSrc:   PainSc: 0-No pain                 Shakiya Mcneary S

## 2021-05-08 DIAGNOSIS — Z419 Encounter for procedure for purposes other than remedying health state, unspecified: Secondary | ICD-10-CM | POA: Diagnosis not present

## 2021-06-05 DIAGNOSIS — Z419 Encounter for procedure for purposes other than remedying health state, unspecified: Secondary | ICD-10-CM | POA: Diagnosis not present

## 2021-07-06 DIAGNOSIS — Z419 Encounter for procedure for purposes other than remedying health state, unspecified: Secondary | ICD-10-CM | POA: Diagnosis not present

## 2021-07-23 DIAGNOSIS — J020 Streptococcal pharyngitis: Secondary | ICD-10-CM | POA: Diagnosis not present

## 2021-08-05 DIAGNOSIS — Z419 Encounter for procedure for purposes other than remedying health state, unspecified: Secondary | ICD-10-CM | POA: Diagnosis not present

## 2021-09-05 DIAGNOSIS — Z419 Encounter for procedure for purposes other than remedying health state, unspecified: Secondary | ICD-10-CM | POA: Diagnosis not present

## 2021-10-05 DIAGNOSIS — Z419 Encounter for procedure for purposes other than remedying health state, unspecified: Secondary | ICD-10-CM | POA: Diagnosis not present

## 2021-11-05 DIAGNOSIS — Z1211 Encounter for screening for malignant neoplasm of colon: Secondary | ICD-10-CM | POA: Diagnosis not present

## 2021-11-05 DIAGNOSIS — F331 Major depressive disorder, recurrent, moderate: Secondary | ICD-10-CM | POA: Diagnosis not present

## 2021-11-05 DIAGNOSIS — E669 Obesity, unspecified: Secondary | ICD-10-CM | POA: Diagnosis not present

## 2021-11-05 DIAGNOSIS — L299 Pruritus, unspecified: Secondary | ICD-10-CM | POA: Diagnosis not present

## 2021-11-05 DIAGNOSIS — R194 Change in bowel habit: Secondary | ICD-10-CM | POA: Diagnosis not present

## 2021-11-05 DIAGNOSIS — R0981 Nasal congestion: Secondary | ICD-10-CM | POA: Diagnosis not present

## 2021-11-05 DIAGNOSIS — H538 Other visual disturbances: Secondary | ICD-10-CM | POA: Diagnosis not present

## 2021-11-05 DIAGNOSIS — Z419 Encounter for procedure for purposes other than remedying health state, unspecified: Secondary | ICD-10-CM | POA: Diagnosis not present

## 2021-11-05 DIAGNOSIS — R197 Diarrhea, unspecified: Secondary | ICD-10-CM | POA: Diagnosis not present

## 2021-12-05 DIAGNOSIS — R194 Change in bowel habit: Secondary | ICD-10-CM | POA: Diagnosis not present

## 2021-12-06 DIAGNOSIS — Z419 Encounter for procedure for purposes other than remedying health state, unspecified: Secondary | ICD-10-CM | POA: Diagnosis not present

## 2022-01-05 DIAGNOSIS — Z419 Encounter for procedure for purposes other than remedying health state, unspecified: Secondary | ICD-10-CM | POA: Diagnosis not present

## 2022-01-27 DIAGNOSIS — R400 Somnolence: Secondary | ICD-10-CM | POA: Diagnosis not present

## 2022-01-27 DIAGNOSIS — N529 Male erectile dysfunction, unspecified: Secondary | ICD-10-CM | POA: Diagnosis not present

## 2022-01-27 DIAGNOSIS — F331 Major depressive disorder, recurrent, moderate: Secondary | ICD-10-CM | POA: Diagnosis not present

## 2022-01-27 DIAGNOSIS — R0981 Nasal congestion: Secondary | ICD-10-CM | POA: Diagnosis not present

## 2022-02-05 DIAGNOSIS — Z419 Encounter for procedure for purposes other than remedying health state, unspecified: Secondary | ICD-10-CM | POA: Diagnosis not present

## 2022-02-17 ENCOUNTER — Institutional Professional Consult (permissible substitution): Payer: Medicaid Other | Admitting: Primary Care

## 2022-03-06 DIAGNOSIS — Z1211 Encounter for screening for malignant neoplasm of colon: Secondary | ICD-10-CM | POA: Diagnosis not present

## 2022-03-06 DIAGNOSIS — D122 Benign neoplasm of ascending colon: Secondary | ICD-10-CM | POA: Diagnosis not present

## 2022-03-07 DIAGNOSIS — Z419 Encounter for procedure for purposes other than remedying health state, unspecified: Secondary | ICD-10-CM | POA: Diagnosis not present

## 2022-03-12 DIAGNOSIS — J343 Hypertrophy of nasal turbinates: Secondary | ICD-10-CM | POA: Diagnosis not present

## 2022-03-12 DIAGNOSIS — J310 Chronic rhinitis: Secondary | ICD-10-CM | POA: Diagnosis not present

## 2022-03-12 DIAGNOSIS — J301 Allergic rhinitis due to pollen: Secondary | ICD-10-CM | POA: Diagnosis not present

## 2022-03-12 DIAGNOSIS — J3489 Other specified disorders of nose and nasal sinuses: Secondary | ICD-10-CM | POA: Diagnosis not present

## 2022-03-12 DIAGNOSIS — R059 Cough, unspecified: Secondary | ICD-10-CM | POA: Diagnosis not present

## 2022-03-12 DIAGNOSIS — J342 Deviated nasal septum: Secondary | ICD-10-CM | POA: Diagnosis not present

## 2022-04-07 DIAGNOSIS — Z419 Encounter for procedure for purposes other than remedying health state, unspecified: Secondary | ICD-10-CM | POA: Diagnosis not present

## 2022-04-09 DIAGNOSIS — J310 Chronic rhinitis: Secondary | ICD-10-CM | POA: Diagnosis not present

## 2022-04-09 DIAGNOSIS — R0982 Postnasal drip: Secondary | ICD-10-CM | POA: Diagnosis not present

## 2022-04-09 DIAGNOSIS — J343 Hypertrophy of nasal turbinates: Secondary | ICD-10-CM | POA: Diagnosis not present

## 2022-04-09 DIAGNOSIS — J3489 Other specified disorders of nose and nasal sinuses: Secondary | ICD-10-CM | POA: Diagnosis not present

## 2022-04-09 DIAGNOSIS — J322 Chronic ethmoidal sinusitis: Secondary | ICD-10-CM | POA: Diagnosis not present

## 2022-04-09 DIAGNOSIS — J320 Chronic maxillary sinusitis: Secondary | ICD-10-CM | POA: Diagnosis not present

## 2022-05-08 DIAGNOSIS — Z419 Encounter for procedure for purposes other than remedying health state, unspecified: Secondary | ICD-10-CM | POA: Diagnosis not present

## 2022-05-23 DIAGNOSIS — R7303 Prediabetes: Secondary | ICD-10-CM | POA: Diagnosis not present

## 2022-05-23 DIAGNOSIS — E669 Obesity, unspecified: Secondary | ICD-10-CM | POA: Diagnosis not present

## 2022-05-23 DIAGNOSIS — F331 Major depressive disorder, recurrent, moderate: Secondary | ICD-10-CM | POA: Diagnosis not present

## 2022-05-23 DIAGNOSIS — E785 Hyperlipidemia, unspecified: Secondary | ICD-10-CM | POA: Diagnosis not present

## 2022-05-23 DIAGNOSIS — R400 Somnolence: Secondary | ICD-10-CM | POA: Diagnosis not present

## 2022-05-23 DIAGNOSIS — R5383 Other fatigue: Secondary | ICD-10-CM | POA: Diagnosis not present

## 2022-05-23 DIAGNOSIS — Z0000 Encounter for general adult medical examination without abnormal findings: Secondary | ICD-10-CM | POA: Diagnosis not present

## 2022-06-06 DIAGNOSIS — Z419 Encounter for procedure for purposes other than remedying health state, unspecified: Secondary | ICD-10-CM | POA: Diagnosis not present

## 2022-07-07 DIAGNOSIS — Z419 Encounter for procedure for purposes other than remedying health state, unspecified: Secondary | ICD-10-CM | POA: Diagnosis not present

## 2022-07-18 DIAGNOSIS — Z6837 Body mass index (BMI) 37.0-37.9, adult: Secondary | ICD-10-CM | POA: Diagnosis not present

## 2022-07-18 DIAGNOSIS — E669 Obesity, unspecified: Secondary | ICD-10-CM | POA: Diagnosis not present

## 2022-07-18 DIAGNOSIS — G4710 Hypersomnia, unspecified: Secondary | ICD-10-CM | POA: Diagnosis not present

## 2022-08-06 DIAGNOSIS — Z419 Encounter for procedure for purposes other than remedying health state, unspecified: Secondary | ICD-10-CM | POA: Diagnosis not present

## 2022-08-06 DIAGNOSIS — G4733 Obstructive sleep apnea (adult) (pediatric): Secondary | ICD-10-CM | POA: Diagnosis not present

## 2022-08-20 DIAGNOSIS — G4733 Obstructive sleep apnea (adult) (pediatric): Secondary | ICD-10-CM | POA: Diagnosis not present

## 2022-08-20 DIAGNOSIS — G4710 Hypersomnia, unspecified: Secondary | ICD-10-CM | POA: Diagnosis not present

## 2022-09-06 DIAGNOSIS — Z419 Encounter for procedure for purposes other than remedying health state, unspecified: Secondary | ICD-10-CM | POA: Diagnosis not present

## 2022-09-18 DIAGNOSIS — F331 Major depressive disorder, recurrent, moderate: Secondary | ICD-10-CM | POA: Diagnosis not present

## 2022-09-18 DIAGNOSIS — G4733 Obstructive sleep apnea (adult) (pediatric): Secondary | ICD-10-CM | POA: Diagnosis not present

## 2022-09-18 DIAGNOSIS — R400 Somnolence: Secondary | ICD-10-CM | POA: Diagnosis not present

## 2022-09-18 DIAGNOSIS — R194 Change in bowel habit: Secondary | ICD-10-CM | POA: Diagnosis not present

## 2022-10-06 DIAGNOSIS — Z419 Encounter for procedure for purposes other than remedying health state, unspecified: Secondary | ICD-10-CM | POA: Diagnosis not present

## 2022-11-06 DIAGNOSIS — Z419 Encounter for procedure for purposes other than remedying health state, unspecified: Secondary | ICD-10-CM | POA: Diagnosis not present

## 2022-11-17 DIAGNOSIS — G4710 Hypersomnia, unspecified: Secondary | ICD-10-CM | POA: Diagnosis not present

## 2022-11-17 DIAGNOSIS — G4733 Obstructive sleep apnea (adult) (pediatric): Secondary | ICD-10-CM | POA: Diagnosis not present

## 2022-12-01 DIAGNOSIS — L299 Pruritus, unspecified: Secondary | ICD-10-CM | POA: Diagnosis not present

## 2022-12-01 DIAGNOSIS — F331 Major depressive disorder, recurrent, moderate: Secondary | ICD-10-CM | POA: Diagnosis not present

## 2022-12-01 DIAGNOSIS — R7303 Prediabetes: Secondary | ICD-10-CM | POA: Diagnosis not present

## 2022-12-07 DIAGNOSIS — Z419 Encounter for procedure for purposes other than remedying health state, unspecified: Secondary | ICD-10-CM | POA: Diagnosis not present

## 2023-01-06 DIAGNOSIS — Z419 Encounter for procedure for purposes other than remedying health state, unspecified: Secondary | ICD-10-CM | POA: Diagnosis not present

## 2023-02-06 DIAGNOSIS — G4733 Obstructive sleep apnea (adult) (pediatric): Secondary | ICD-10-CM | POA: Diagnosis not present

## 2023-02-06 DIAGNOSIS — Z419 Encounter for procedure for purposes other than remedying health state, unspecified: Secondary | ICD-10-CM | POA: Diagnosis not present

## 2023-02-07 DIAGNOSIS — G4710 Hypersomnia, unspecified: Secondary | ICD-10-CM | POA: Diagnosis not present

## 2023-02-24 DIAGNOSIS — Z6837 Body mass index (BMI) 37.0-37.9, adult: Secondary | ICD-10-CM | POA: Diagnosis not present

## 2023-02-24 DIAGNOSIS — G47419 Narcolepsy without cataplexy: Secondary | ICD-10-CM | POA: Diagnosis not present

## 2023-02-24 DIAGNOSIS — G4733 Obstructive sleep apnea (adult) (pediatric): Secondary | ICD-10-CM | POA: Diagnosis not present

## 2023-02-24 DIAGNOSIS — E66812 Obesity, class 2: Secondary | ICD-10-CM | POA: Diagnosis not present

## 2023-02-24 DIAGNOSIS — G4710 Hypersomnia, unspecified: Secondary | ICD-10-CM | POA: Diagnosis not present

## 2023-03-03 DIAGNOSIS — F331 Major depressive disorder, recurrent, moderate: Secondary | ICD-10-CM | POA: Diagnosis not present

## 2023-03-03 DIAGNOSIS — G47419 Narcolepsy without cataplexy: Secondary | ICD-10-CM | POA: Diagnosis not present

## 2023-03-03 DIAGNOSIS — L299 Pruritus, unspecified: Secondary | ICD-10-CM | POA: Diagnosis not present

## 2023-03-08 DIAGNOSIS — Z419 Encounter for procedure for purposes other than remedying health state, unspecified: Secondary | ICD-10-CM | POA: Diagnosis not present

## 2023-04-07 DIAGNOSIS — E66812 Obesity, class 2: Secondary | ICD-10-CM | POA: Diagnosis not present

## 2023-04-07 DIAGNOSIS — G4733 Obstructive sleep apnea (adult) (pediatric): Secondary | ICD-10-CM | POA: Diagnosis not present

## 2023-04-07 DIAGNOSIS — G4710 Hypersomnia, unspecified: Secondary | ICD-10-CM | POA: Diagnosis not present

## 2023-04-07 DIAGNOSIS — Z6839 Body mass index (BMI) 39.0-39.9, adult: Secondary | ICD-10-CM | POA: Diagnosis not present

## 2023-04-07 DIAGNOSIS — G47419 Narcolepsy without cataplexy: Secondary | ICD-10-CM | POA: Diagnosis not present

## 2023-04-08 DIAGNOSIS — Z419 Encounter for procedure for purposes other than remedying health state, unspecified: Secondary | ICD-10-CM | POA: Diagnosis not present

## 2023-05-06 DIAGNOSIS — G4733 Obstructive sleep apnea (adult) (pediatric): Secondary | ICD-10-CM | POA: Diagnosis not present

## 2023-05-09 DIAGNOSIS — Z419 Encounter for procedure for purposes other than remedying health state, unspecified: Secondary | ICD-10-CM | POA: Diagnosis not present

## 2023-06-06 DIAGNOSIS — Z419 Encounter for procedure for purposes other than remedying health state, unspecified: Secondary | ICD-10-CM | POA: Diagnosis not present

## 2023-06-25 DIAGNOSIS — Z6839 Body mass index (BMI) 39.0-39.9, adult: Secondary | ICD-10-CM | POA: Diagnosis not present

## 2023-06-25 DIAGNOSIS — G4733 Obstructive sleep apnea (adult) (pediatric): Secondary | ICD-10-CM | POA: Diagnosis not present

## 2023-06-25 DIAGNOSIS — E66812 Obesity, class 2: Secondary | ICD-10-CM | POA: Diagnosis not present

## 2023-06-25 DIAGNOSIS — G47419 Narcolepsy without cataplexy: Secondary | ICD-10-CM | POA: Diagnosis not present

## 2023-07-18 DIAGNOSIS — Z419 Encounter for procedure for purposes other than remedying health state, unspecified: Secondary | ICD-10-CM | POA: Diagnosis not present

## 2023-07-22 IMAGING — DX DG NECK SOFT TISSUE
2 series · 2 of 2 positions shown · non-contrast
Comparison: None.

CLINICAL DATA: Food lodged.

EXAM:
NECK SOFT TISSUES - 1+ VIEW

[neck lat]
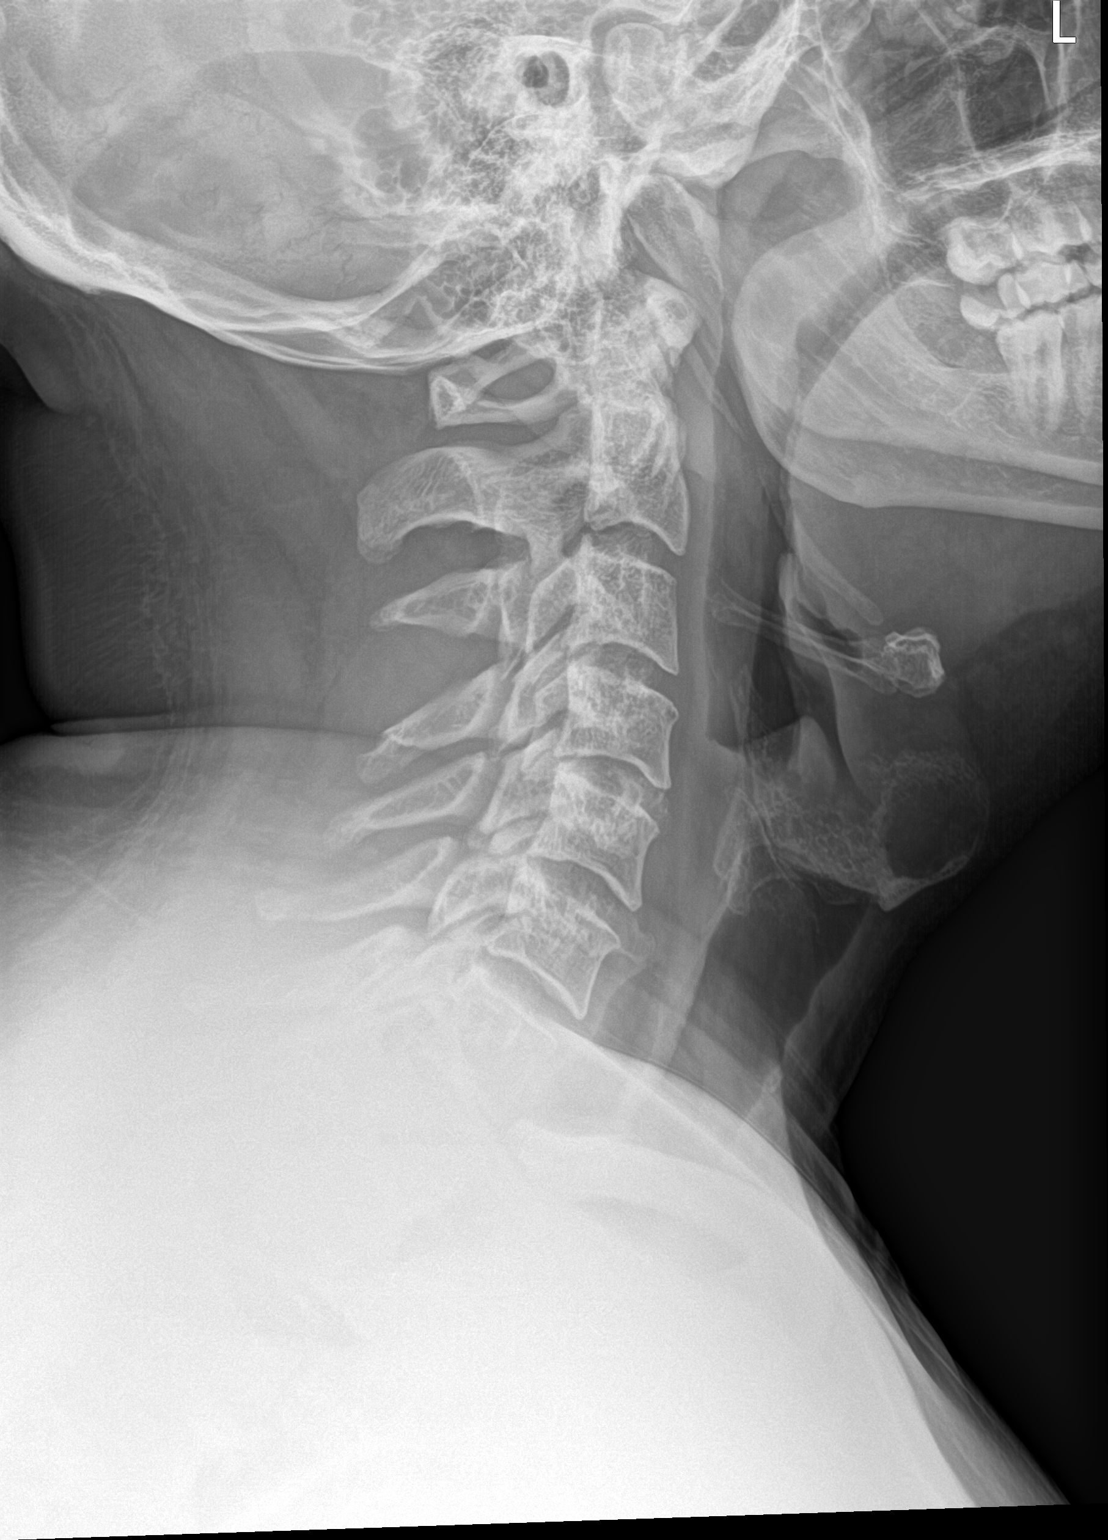

[neck ap]
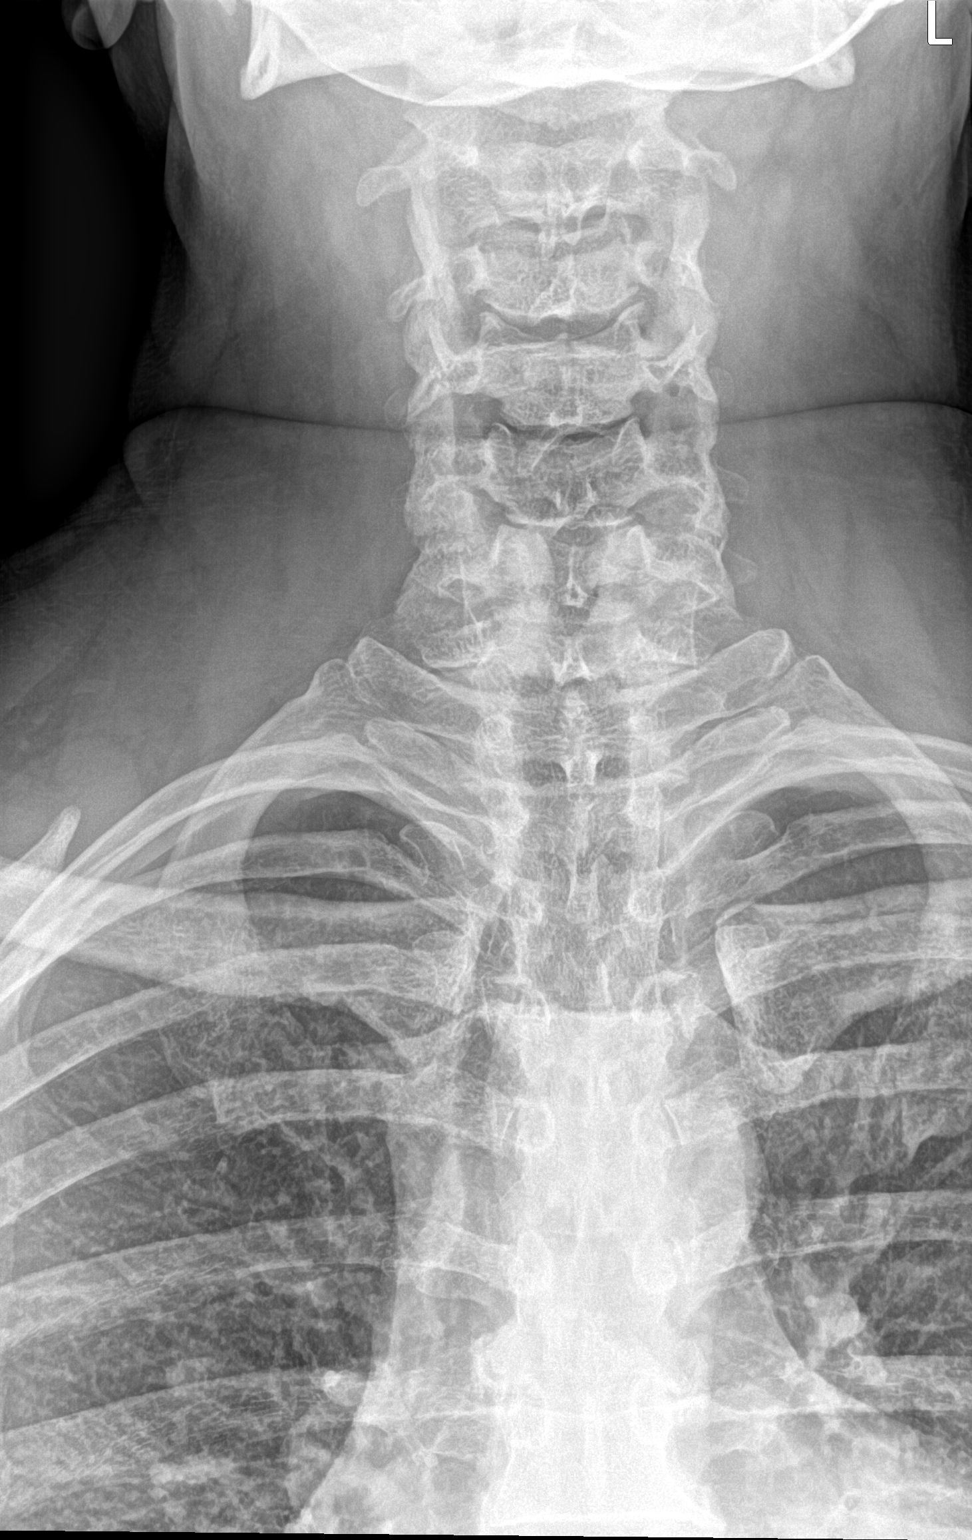

[2 of 2 positions shown; findings below may reference images not displayed]

FINDINGS: There is no evidence of retropharyngeal soft tissue swelling or
epiglottic enlargement. The cervical airway is unremarkable and no
radio-opaque foreign body identified.
IMPRESSION: Negative.

## 2023-08-17 DIAGNOSIS — Z419 Encounter for procedure for purposes other than remedying health state, unspecified: Secondary | ICD-10-CM | POA: Diagnosis not present

## 2023-09-03 DIAGNOSIS — K5900 Constipation, unspecified: Secondary | ICD-10-CM | POA: Diagnosis not present

## 2023-09-17 DIAGNOSIS — Z419 Encounter for procedure for purposes other than remedying health state, unspecified: Secondary | ICD-10-CM | POA: Diagnosis not present

## 2023-10-17 DIAGNOSIS — Z419 Encounter for procedure for purposes other than remedying health state, unspecified: Secondary | ICD-10-CM | POA: Diagnosis not present

## 2023-11-17 DIAGNOSIS — Z419 Encounter for procedure for purposes other than remedying health state, unspecified: Secondary | ICD-10-CM | POA: Diagnosis not present

## 2023-12-18 DIAGNOSIS — Z419 Encounter for procedure for purposes other than remedying health state, unspecified: Secondary | ICD-10-CM | POA: Diagnosis not present

## 2024-01-17 DIAGNOSIS — Z419 Encounter for procedure for purposes other than remedying health state, unspecified: Secondary | ICD-10-CM | POA: Diagnosis not present

## 2024-01-26 DIAGNOSIS — G47419 Narcolepsy without cataplexy: Secondary | ICD-10-CM | POA: Diagnosis not present

## 2024-01-26 DIAGNOSIS — G4733 Obstructive sleep apnea (adult) (pediatric): Secondary | ICD-10-CM | POA: Diagnosis not present

## 2024-01-26 DIAGNOSIS — G4710 Hypersomnia, unspecified: Secondary | ICD-10-CM | POA: Diagnosis not present

## 2024-01-26 DIAGNOSIS — Z6841 Body Mass Index (BMI) 40.0 and over, adult: Secondary | ICD-10-CM | POA: Diagnosis not present

## 2024-01-26 DIAGNOSIS — E66813 Obesity, class 3: Secondary | ICD-10-CM | POA: Diagnosis not present

## 2024-02-17 DIAGNOSIS — Z419 Encounter for procedure for purposes other than remedying health state, unspecified: Secondary | ICD-10-CM | POA: Diagnosis not present

## 2024-03-18 DIAGNOSIS — Z419 Encounter for procedure for purposes other than remedying health state, unspecified: Secondary | ICD-10-CM | POA: Diagnosis not present
